# Patient Record
Sex: Male | Born: 1990 | Race: Black or African American | Hispanic: No | Marital: Single | Smoking: Never smoker
Health system: Southern US, Community
[De-identification: ages and names within clinical notes are randomized; demographics above are authoritative.]

## PROBLEM LIST (undated history)

## (undated) DIAGNOSIS — I10 Essential (primary) hypertension: Secondary | ICD-10-CM

---

## 2014-04-17 ENCOUNTER — Emergency Department (HOSPITAL_COMMUNITY)
Admission: EM | Admit: 2014-04-17 | Discharge: 2014-04-17 | Disposition: A | Discharge status: Home / Self Care | Payer: No Typology Code available for payment source | Attending: Emergency Medicine | Admitting: Emergency Medicine

## 2014-04-17 ENCOUNTER — Encounter (HOSPITAL_COMMUNITY): Payer: Self-pay | Admitting: Physical Medicine and Rehabilitation

## 2014-04-17 DIAGNOSIS — S79922A Unspecified injury of left thigh, initial encounter: Secondary | ICD-10-CM | POA: Insufficient documentation

## 2014-04-17 DIAGNOSIS — M79652 Pain in left thigh: Secondary | ICD-10-CM

## 2014-04-17 DIAGNOSIS — Y92410 Unspecified street and highway as the place of occurrence of the external cause: Secondary | ICD-10-CM | POA: Insufficient documentation

## 2014-04-17 DIAGNOSIS — M545 Low back pain, unspecified: Secondary | ICD-10-CM

## 2014-04-17 DIAGNOSIS — S199XXA Unspecified injury of neck, initial encounter: Secondary | ICD-10-CM | POA: Diagnosis not present

## 2014-04-17 DIAGNOSIS — I10 Essential (primary) hypertension: Secondary | ICD-10-CM | POA: Diagnosis not present

## 2014-04-17 DIAGNOSIS — Y998 Other external cause status: Secondary | ICD-10-CM | POA: Diagnosis not present

## 2014-04-17 DIAGNOSIS — M542 Cervicalgia: Secondary | ICD-10-CM

## 2014-04-17 DIAGNOSIS — Y9389 Activity, other specified: Secondary | ICD-10-CM | POA: Insufficient documentation

## 2014-04-17 DIAGNOSIS — S3992XA Unspecified injury of lower back, initial encounter: Secondary | ICD-10-CM | POA: Insufficient documentation

## 2014-04-17 DIAGNOSIS — M549 Dorsalgia, unspecified: Secondary | ICD-10-CM

## 2014-04-17 HISTORY — DX: Essential (primary) hypertension: I10

## 2014-04-17 MED ORDER — IBUPROFEN 600 MG PO TABS: 600.0000 mg | ORAL_TABLET | Freq: Four times a day (QID) | ORAL | Status: DC | PRN

## 2014-04-17 MED ORDER — METHOCARBAMOL 500 MG PO TABS
500.0000 mg | ORAL_TABLET | Freq: Two times a day (BID) | ORAL | Status: DC
Start: 2014-04-17 — End: 2016-02-14

## 2014-04-17 MED ORDER — TRAMADOL HCL 50 MG PO TABS: 50.0000 mg | ORAL_TABLET | Freq: Four times a day (QID) | ORAL | Status: DC | PRN
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
## 2015-02-03 ENCOUNTER — Emergency Department (HOSPITAL_COMMUNITY)
Admission: EM | Admit: 2015-02-03 | Discharge: 2015-02-03 | Disposition: A | Discharge status: Home / Self Care | Payer: Self-pay | Attending: Emergency Medicine | Admitting: Emergency Medicine

## 2015-02-03 ENCOUNTER — Encounter (HOSPITAL_COMMUNITY): Payer: Self-pay | Admitting: Emergency Medicine

## 2015-02-03 DIAGNOSIS — J020 Streptococcal pharyngitis: Secondary | ICD-10-CM | POA: Insufficient documentation

## 2015-02-03 DIAGNOSIS — I10 Essential (primary) hypertension: Secondary | ICD-10-CM | POA: Insufficient documentation

## 2015-02-03 MED ORDER — AZITHROMYCIN 250 MG PO TABS
250.0000 mg | ORAL_TABLET | Freq: Every day | ORAL | Status: DC
Start: 2015-02-03 — End: 2016-02-14

## 2015-02-03 MED ORDER — IBUPROFEN 400 MG PO TABS
800.0000 mg | ORAL_TABLET | Freq: Once | ORAL | Status: AC
  Administered 2015-02-03: 800 mg via ORAL
  Filled 2015-02-03: qty 2

## 2015-02-03 NOTE — Discharge Instructions (Signed)

## 2015-02-03 NOTE — ED Provider Notes (Signed)
CSN: [id number]     Arrival date & time 02/03/15  0818 History  By signing my name below, I, Roy Henderson, attest that this documentation has been prepared under the direction and in the presence of Teressa Lower, NP Electronically Signed: Charline Bills, ED Scribe 02/03/2015 at 9:18 AM.   Chief Complaint  Patient presents with  . Sore Throat   The history is provided by the patient. No language interpreter was used.   HPI Comments: Roy Henderson is a 25 y.o. male who presents to the Emergency Department complaining of sudden onset of fever around 3 AM this morning. Tmax 102 F, ED temperature 100.6 F. Pt reports associated chills, sore throat, congestion and HA. Pt states that he was diagnosed with strep 2 weeks ago; states he gets it off. Pt was treated with Amoxicillin. No treatments tried PTA. He denies rash and any other symptoms. No sick contacts.  Past Medical History  Diagnosis Date  . Hypertension    History reviewed. No pertinent past surgical history. No family history on file. Social History  Substance Use Topics  . Smoking status: Never Smoker   . Smokeless tobacco: None  . Alcohol Use: No    Review of Systems  Constitutional: Positive for fever and chills.  HENT: Positive for congestion and sore throat.   Skin: Negative for rash.  Neurological: Positive for headaches.  All other systems reviewed and are negative.  Allergies  Review of patient's allergies indicates no known allergies.  Home Medications   Prior to Admission medications   Medication Sig Start Date End Date Taking? Authorizing Provider  ibuprofen (ADVIL,MOTRIN) 600 MG tablet Take 1 tablet (600 mg total) by mouth every 6 (six) hours as needed. 04/17/14   Junius Finner, PA-C  methocarbamol (ROBAXIN) 500 MG tablet Take 1 tablet (500 mg total) by mouth 2 (two) times daily. 04/17/14   Junius Finner, PA-C  traMADol (ULTRAM) 50 MG tablet Take 1 tablet (50 mg total) by mouth every 6 (six) hours as needed.  04/17/14   Junius Finner, PA-C   BP 148/96 mmHg  Pulse 113  Temp(Src) 100.6 F (38.1 C) (Oral)  Resp 22  Ht 6' (1.829 m)  Wt 284 lb (128.822 kg)  BMI 38.51 kg/m2  SpO2 100% Physical Exam  Constitutional: He is oriented to person, place, and time. He appears well-developed and well-nourished. No distress.  HENT:  Head: Normocephalic and atraumatic.  Right Ear: External ear normal.  Left Ear: External ear normal.  Bilateral pharyngeal swelling and exudate  Eyes: Conjunctivae and EOM are normal.  Neck: Normal range of motion. Neck supple. No tracheal deviation present.  Cardiovascular: Normal rate.   Pulmonary/Chest: Effort normal. No respiratory distress.  Abdominal: Soft. Bowel sounds are normal. There is no tenderness.  Musculoskeletal: Normal range of motion.  Neurological: He is alert and oriented to person, place, and time.  Skin: Skin is warm and dry.  Psychiatric: He has a normal mood and affect. His behavior is normal.  Nursing note and vitals reviewed.  ED Course  Procedures (including critical care time) DIAGNOSTIC STUDIES: Oxygen Saturation is 100% on RA, normal by my interpretation.    COORDINATION OF CARE: 9:12 AM-Discussed treatment plan which includes azithromycin with pt at bedside and pt agreed to plan.   Labs Review Labs Reviewed - No data to display  Imaging Review No results found. I have personally reviewed and evaluated these images and lab results as part of my medical decision-making.   EKG Interpretation None  MDM   Final diagnoses:  Strep sore throat    Pt was treated 2 weeks ago with amoxicillin. Will give zithromax. Discussed return precautions and follow up  I personally performed the services described in this documentation, which was scribed in my presence. The recorded information has been reviewed and is accurate.    Teressa Lower, NP 02/03/15 1610  Alvira Monday, MD 02/03/15 2341

## 2015-02-03 NOTE — ED Notes (Signed)
C/o sore throat, HA and congestion onset today, no V/D or other complaints, A/O X4 and in NAD

## 2015-03-04 ENCOUNTER — Emergency Department (HOSPITAL_COMMUNITY)
Admission: EM | Admit: 2015-03-04 | Discharge: 2015-03-04 | Disposition: A | Discharge status: Home / Self Care | Payer: 59 | Attending: Emergency Medicine | Admitting: Emergency Medicine

## 2015-03-04 ENCOUNTER — Encounter (HOSPITAL_COMMUNITY): Payer: Self-pay

## 2015-03-04 DIAGNOSIS — Z79899 Other long term (current) drug therapy: Secondary | ICD-10-CM | POA: Diagnosis not present

## 2015-03-04 DIAGNOSIS — I10 Essential (primary) hypertension: Secondary | ICD-10-CM | POA: Insufficient documentation

## 2015-03-04 DIAGNOSIS — J020 Streptococcal pharyngitis: Secondary | ICD-10-CM | POA: Insufficient documentation

## 2015-03-04 DIAGNOSIS — R61 Generalized hyperhidrosis: Secondary | ICD-10-CM | POA: Insufficient documentation

## 2015-03-04 DIAGNOSIS — J029 Acute pharyngitis, unspecified: Secondary | ICD-10-CM | POA: Diagnosis present

## 2015-03-04 LAB — RAPID STREP SCREEN (MED CTR MEBANE ONLY): STREPTOCOCCUS, GROUP A SCREEN (DIRECT): NEGATIVE

## 2015-03-04 MED ORDER — DEXAMETHASONE SODIUM PHOSPHATE 10 MG/ML IJ SOLN
6.0000 mg | Freq: Once | INTRAMUSCULAR | Status: AC
  Administered 2015-03-04: 6 mg via INTRAMUSCULAR
  Filled 2015-03-04: qty 1

## 2015-03-04 MED ORDER — PENICILLIN G BENZATHINE 1200000 UNIT/2ML IM SUSP
1.2000 10*6.[IU] | Freq: Once | INTRAMUSCULAR | Status: AC
  Administered 2015-03-04: 1.2 10*6.[IU] via INTRAMUSCULAR
  Filled 2015-03-04: qty 2

## 2015-03-04 MED ORDER — KETOROLAC TROMETHAMINE 60 MG/2ML IM SOLN
60.0000 mg | Freq: Once | INTRAMUSCULAR | Status: AC
  Administered 2015-03-04: 60 mg via INTRAMUSCULAR
  Filled 2015-03-04: qty 2

## 2015-03-04 NOTE — ED Notes (Signed)
Sore throat began yesterday with chills ,  Pt. Reports that when he woke up this am he felt worse.  Pt. Denies any n/v/d. NO fever but does have chills. No body aches, intermittent lt. earache

## 2015-03-04 NOTE — Discharge Instructions (Signed)
Pharyngitis Pharyngitis is redness, pain, and swelling (inflammation) of your pharynx.  CAUSES  Pharyngitis is usually caused by infection. Most of the time, these infections are from viruses (viral) and are part of a cold. However, sometimes pharyngitis is caused by bacteria (bacterial). Pharyngitis can also be caused by allergies. Viral pharyngitis may be spread from person to person by coughing, sneezing, and personal items or utensils (cups, forks, spoons, toothbrushes). Bacterial pharyngitis may be spread from person to person by more intimate contact, such as kissing.  SIGNS AND SYMPTOMS  Symptoms of pharyngitis include:   Sore throat.   Tiredness (fatigue).   Low-grade fever.   Headache.  Joint pain and muscle aches.  Skin rashes.  Swollen lymph nodes.  Plaque-like film on throat or tonsils (often seen with bacterial pharyngitis). DIAGNOSIS  Your health care provider will ask you questions about your illness and your symptoms. Your medical history, along with a physical exam, is often all that is needed to diagnose pharyngitis. Sometimes, a rapid strep test is done. Other lab tests may also be done, depending on the suspected cause.  TREATMENT  Viral pharyngitis will usually get better in 3-4 days without the use of medicine. Bacterial pharyngitis is treated with medicines that kill germs (antibiotics).  HOME CARE INSTRUCTIONS   Drink enough water and fluids to keep your urine clear or pale yellow.   Only take over-the-counter or prescription medicines as directed by your health care provider:   If you are prescribed antibiotics, make sure you finish them even if you start to feel better.   Do not take aspirin.   Get lots of rest.   Gargle with 8 oz of salt water ( tsp of salt per 1 qt of water) as often as every 1-2 hours to soothe your throat.   Throat lozenges (if you are not at risk for choking) or sprays may be used to soothe your throat. SEEK MEDICAL  CARE IF:   You have large, tender lumps in your neck.  You have a rash.  You cough up green, yellow-brown, or bloody spit. SEEK IMMEDIATE MEDICAL CARE IF:   Your neck becomes stiff.  You drool or are unable to swallow liquids.  You vomit or are unable to keep medicines or liquids down.  You have severe pain that does not go away with the use of recommended medicines.  You have trouble breathing (not caused by a stuffy nose). MAKE SURE YOU:   Understand these instructions.  Will watch your condition.  Will get help right away if you are not doing well or get worse.   This information is not intended to replace advice given to you by your health care provider. Make sure you discuss any questions you have with your health care provider.   Document Released: 12/27/2004 Document Revised: 10/17/2012 Document Reviewed: 09/03/2012 Elsevier Interactive Patient Education 2016 Else[employer] Inc[employer]  Strep Throat Strep throat is a bacterial infection of the throat. Your health care provider may call the infection tonsillitis or pharyngitis, depending on whether there is swelling in the tonsils or at the back of the throat. Strep throat is most common during the cold months of the year in children who are 10-105 years of age, but it can happen during any season in people of any age. This infection is spread from person to person (contagious) through coughing, sneezing, or close contact. CAUSES Strep throat is caused by the bacteria called Streptococcus pyogenes. RISK FACTORS This condition is more likely to  develop in: °· People who spend time in crowded places where the infection can spread easily. °· People who have close contact with someone who has strep throat. °SYMPTOMS °Symptoms of this condition include: °· Fever or chills.   °· Redness, swelling, or pain in the tonsils or throat. °· Pain or difficulty when swallowing. °· White or yellow spots on the tonsils or throat. °· Swollen, tender glands  in the neck or under the jaw. °· Red rash all over the body (rare). °DIAGNOSIS °This condition is diagnosed by performing a rapid strep test or by taking a swab of your throat (throat culture test). Results from a rapid strep test are usually ready in a few minutes, but throat culture test results are available after one or two days. °TREATMENT °This condition is treated with antibiotic medicine. °HOME CARE INSTRUCTIONS °Medicines °· Take over-the-counter and prescription medicines only as told by your health care provider. °· Take your antibiotic as told by your health care provider. Do not stop taking the antibiotic even if you start to feel better. °· Have family members who also have a sore throat or fever tested for strep throat. They may need antibiotics if they have the strep infection. °Eating and Drinking °· Do not share food, drinking cups, or personal items that could cause the infection to spread to other people. °· If swallowing is difficult, try eating soft foods until your sore throat feels better. °· Drink enough fluid to keep your urine clear or pale yellow. °General Instructions °· Gargle with a salt-water mixture 3-4 times per day or as needed. To make a salt-water mixture, completely dissolve ½-1 tsp of salt in 1 cup of warm water. °· Make sure that all household members wash their hands well. °· Get plenty of rest. °· Stay home from school or work until you have been taking antibiotics for 24 hours. °· Keep all follow-up visits as told by your health care provider. This is important. °SEEK MEDICAL CARE IF: °· The glands in your neck continue to get bigger. °· You develop a rash, cough, or earache. °· You cough up a thick liquid that is green, yellow-brown, or bloody. °· You have pain or discomfort that does not get better with medicine. °· Your problems seem to be getting worse rather than better. °· You have a fever. °SEEK IMMEDIATE MEDICAL CARE IF: °· You have new symptoms, such as vomiting,  severe headache, stiff or painful neck, chest pain, or shortness of breath. °· You have severe throat pain, drooling, or changes in your voice. °· You have swelling of the neck, or the skin on the neck becomes red and tender. °· You have signs of dehydration, such as fatigue, dry mouth, and decreased urination. °· You become increasingly sleepy, or you cannot wake up completely. °· Your joints become red or painful. °  °This information is not intended to replace advice given to you by your health care provider. Make sure you discuss any questions you have with your health care provider. °  °Document Released: 12/25/1999 Document Revised: 09/17/2014 Document Reviewed: 04/21/2014 °Elsevier Interactive Patient Education ©2016 Elsevier Inc. ° °

## 2015-03-04 NOTE — ED Provider Notes (Signed)
CSN: [id number]     Arrival date & time 03/04/15  2841 History  By signing my name below, I, Tanda Rockers, attest that this documentation has been prepared under the direction and in the presence of Danelle Berry, PA-C. Electronically Signed: Tanda Rockers, ED Scribe. 03/04/2015. 9:08 AM.   Chief Complaint  Patient presents with  . Sore Throat   No language interpreter was used.     HPI Comments: Roy Henderson is a 25 y.o. male who presents to the Emergency Department complaining of gradual onset, constant, 7/10, worsening, sore throat x 1 day. Pt is able to tolerate his own secretions, can sip fluids, however it is painful. Pt also complains of chills, fever, nasal congestion, rhinorrhea, dry cough, and intermittent left ear pain for several days, sweats last night.  He came to the ER because over the past day his throat suddenly worsened and he has had strep throat 2 times in the past year, it now feels similar to when he's had strep.  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 Denies neck pain, voice change, nausea, vomiting, shortness of breath, diarrhea, sinus pressure, or any other associated symptoms.   Past Medical History  Diagnosis Date  . Hypertension    History reviewed. No pertinent past surgical history. No family history on file. Social History  Substance Use Topics  . Smoking status: Never Smoker   . Smokeless tobacco: None  . Alcohol Use: No    Review of Systems  Constitutional: Positive for fever, chills and diaphoresis.  HENT: Positive for congestion, ear pain (left), rhinorrhea and sore throat. Negative for sinus pressure and voice change.   Respiratory: Positive for cough. Negative for shortness of breath.   Gastrointestinal: Negative for nausea, vomiting and diarrhea.  Musculoskeletal: Negative for neck pain.  All other systems reviewed and are negative.  Allergies  Review of patient's allergies indicates no known allergies.  Home  Medications   Prior to Admission medications   Medication Sig Start Date End Date Taking? Authorizing Provider  lisinopril (PRINIVIL,ZESTRIL) 30 MG tablet Take 30 mg by mouth daily.   Yes Historical Provider, MD  azithromycin (ZITHROMAX) 250 MG tablet Take 1 tablet (250 mg total) by mouth daily. Take first 2 tablets together, then 1 every day until finished. 02/03/15   Teressa Lower, NP  ibuprofen (ADVIL,MOTRIN) 600 MG tablet Take 1 tablet (600 mg total) by mouth every 6 (six) hours as needed. 04/17/14   Junius Finner, PA-C  methocarbamol (ROBAXIN) 500 MG tablet Take 1 tablet (500 mg total) by mouth 2 (two) times daily. 04/17/14   Junius Finner, PA-C  traMADol (ULTRAM) 50 MG tablet Take 1 tablet (50 mg total) by mouth every 6 (six) hours as needed. 04/17/14   Junius Finner, PA-C   BP 143/94 mmHg  Pulse 102  Temp(Src) 99 F (37.2 C) (Oral)  Resp 18  SpO2 100%   Physical Exam  Constitutional: He is oriented to person, place, and time. He appears well-developed and well-nourished. He is cooperative.  Non-toxic appearance. He does not have a sickly appearance. He does not appear ill. No distress.  HENT:  Head: Normocephalic and atraumatic.  Right Ear: Tympanic membrane and external ear normal.  Left Ear: Tympanic membrane and external ear normal.  Nose: Nose normal.  Mouth/Throat: Uvula is midline and mucous membranes are normal. Mucous membranes are not pale, not dry and not cyanotic. No trismus in the jaw. No uvula swelling. Oropharyngeal exudate, posterior oropharyngeal  edema and posterior oropharyngeal erythema present. No tonsillar abscesses.  Tonsils are erythematous with exudates. Left tonsil4+. Right 3+, uvula midline, no asymmetry to anterior neck, bilateral proximal cervical lymphadenopathy Bilateral TM normal Nasal mucosa edematous with discharge. Pt able to swallow and clear secretions  Eyes: Conjunctivae and EOM are normal. Pupils are equal, round, and reactive to light. Right eye  exhibits no discharge. Left eye exhibits no discharge. No scleral icterus.  Neck: Trachea normal, normal range of motion and full passive range of motion without pain. Neck supple. No JVD present. No tracheal deviation present. No thyromegaly present.  Phonation normal  Cardiovascular: Normal rate, regular rhythm, normal heart sounds and intact distal pulses.  Exam reveals no gallop and no friction rub.   No murmur heard. Pulmonary/Chest: Effort normal and breath sounds normal. No respiratory distress. He has no wheezes. He has no rales. He exhibits no tenderness.  Abdominal: Soft. Bowel sounds are normal. He exhibits no distension and no mass. There is no tenderness. There is no rebound and no guarding.  Musculoskeletal: Normal range of motion. He exhibits no edema or tenderness.  Lymphadenopathy:       Head (right side): Submental, submandibular and tonsillar adenopathy present.       Head (left side): Submental, submandibular and tonsillar adenopathy present.    He has cervical adenopathy.       Right cervical: Superficial cervical adenopathy present.       Left cervical: Superficial cervical adenopathy present.  Neurological: He is alert and oriented to person, place, and time. He has normal reflexes. No cranial nerve deficit. He exhibits normal muscle tone. Coordination normal.  Skin: Skin is warm and dry. No rash noted. He is not diaphoretic. No erythema. No pallor.  Psychiatric: He has a normal mood and affect. His behavior is normal. Judgment and thought content normal.  Nursing note and vitals reviewed.   ED Course  Procedures (including critical care time)  DIAGNOSTIC STUDIES: Oxygen Saturation is 100% on RA, normal by my interpretation.    COORDINATION OF CARE: 9:02 AM-Discussed treatment plan which includes rapid strep test with pt at bedside and pt agreed to plan.   Labs Review Labs Reviewed  RAPID STREP SCREEN (NOT AT [facility])  CULTURE, GROUP A STREP [facility])    Imaging  Review No results found. I have personally reviewed and evaluated these lab results as part of my medical decision-making.   EKG Interpretation None      MDM   Pt with edematous, erythematous and exudative OP and tonsils.  Left tonsil more prominent, but uvula is midline and pt does not have any other concerning signs of sx of PTA. No trismus. Pt able to tolerate PO's. No dyspnea.    He has hx of 2 recent episodes of strep throat, exposure at job to many sick children.  Rapid strep negative, however given hx and exposure, will treat for strep pharyngitis, and give ENT referral. Pt given toradal, decadron and bicillin injections. Marland Kitchen Specific return precautions discussed,, pt verbalized understanding. Pt able to drink water in ED without difficulty with intact air way.  He was discharged in good condition.  Work note provided.    Final diagnoses:  Strep throat   I personally performed the services described in this documentation, which was scribed in my presence. The recorded information has been reviewed and is accurate.        Danelle Berry, PA-C 03/04/15 2248  Eber Hong, MD 03/05/15 [phone]

## 2015-03-06 LAB — CULTURE, GROUP A STREP (THRC)

## 2015-03-07 ENCOUNTER — Telehealth (HOSPITAL_COMMUNITY): Payer: Self-pay

## 2015-03-07 NOTE — Telephone Encounter (Signed)
Post ED Visit - Positive Culture Follow-up  Culture report reviewed by antimicrobial stewardship pharmacist:   Enzo Bi, Pharm.D.  Celedonio Miyamoto, Pharm.D., BCPS  Garvin Fila, Pharm.D.  Georgina Pillion, Pharm.D., BCPS  [place], [address].D., BCPS, AAHIVP  Estella Husk, Pharm.D., BCPS, AAHIVP  Tennis Must, Pharm.D.  Rob Oswaldo Done, [address].D. Carmon Sails Rumbarger, Pharm.D.  Positive throat culture, Group A Strep Received Bicillin LA in the ED, organism sensitive to the same and no further patient follow-up is required at this time.  Arvid Right 03/07/2015, 10:38 PM

## 2015-03-08 ENCOUNTER — Telehealth: Payer: Self-pay | Admitting: *Deleted

## 2016-02-14 ENCOUNTER — Encounter (HOSPITAL_COMMUNITY): Payer: Self-pay | Admitting: Emergency Medicine

## 2016-02-14 ENCOUNTER — Ambulatory Visit (HOSPITAL_COMMUNITY)
Admission: EM | Admit: 2016-02-14 | Discharge: 2016-02-14 | Disposition: A | Discharge status: Home / Self Care | Payer: 59 | Attending: Family Medicine | Admitting: Family Medicine

## 2016-02-14 DIAGNOSIS — J029 Acute pharyngitis, unspecified: Secondary | ICD-10-CM | POA: Insufficient documentation

## 2016-02-14 DIAGNOSIS — I10 Essential (primary) hypertension: Secondary | ICD-10-CM | POA: Insufficient documentation

## 2016-02-14 LAB — POCT RAPID STREP A: Streptococcus, Group A Screen (Direct): NEGATIVE

## 2016-02-14 MED ORDER — PENICILLIN V POTASSIUM 500 MG PO TABS
500.0000 mg | ORAL_TABLET | Freq: Three times a day (TID) | ORAL | 0 refills | Status: DC
Start: 2016-02-14 — End: 2016-12-14

## 2016-02-14 MED ORDER — CHLORHEXIDINE GLUCONATE 0.12 % MT SOLN: 15.0000 mL | Freq: Two times a day (BID) | OROMUCOSAL | 0 refills | Status: DC

## 2016-02-14 NOTE — ED Provider Notes (Addendum)
[facility]    CSN: [id number] Arrival date & time: 02/14/16  1606     History   Chief Complaint Chief Complaint  Patient presents with  . Sore Throat    HPI Roy Henderson is a 26 y.o. male.   The patient presented to the [facility] with a complaint of a sore throat x 4 days. The patient denied any fever and did report a hx of strep throat.   No cold symptoms such as cough or congestion.      Past Medical History:  Diagnosis Date  . Hypertension     There are no active problems to display for this patient.   History reviewed. No pertinent surgical history.     Home Medications    Prior to Admission medications   Medication Sig Start Date End Date Taking? Authorizing Provider  chlorhexidine (PERIDEX) 0.12 % solution Use as directed 15 mLs in the mouth or throat 2 (two) times daily. 02/14/16   Elvina Sidle, MD  penicillin v potassium (VEETID) 500 MG tablet Take 1 tablet (500 mg total) by mouth 3 (three) times daily. 02/14/16   Elvina Sidle, MD    Family History History reviewed. No pertinent family history.  Social History Social History  Substance Use Topics  . Smoking status: Never Smoker  . Smokeless tobacco: Not on file  . Alcohol use No     Allergies   Patient has no known allergies.   Review of Systems Review of Systems  Constitutional: Negative.   HENT: Positive for sore throat.   Eyes: Negative.   Gastrointestinal: Negative.   Musculoskeletal: Negative.   Neurological: Negative.   All other systems reviewed and are negative.    Physical Exam Triage Vital Signs ED Triage Vitals [02/14/16 1629]  Enc Vitals Group     BP 133/77     Pulse Rate 105     Resp 20     Temp 97.9 F (36.6 C)     Temp Source Oral     SpO2 100 %     Weight      Height      Head Circumference      Peak Flow      Pain Score 0     Pain Loc      Pain Edu?      Excl. in GC?    No data found.   Updated Vital Signs BP 133/77 (BP Location: Right  Arm)   Pulse 105   Temp 97.9 F (36.6 C) (Oral)   Resp 20   SpO2 100%    Physical Exam  Constitutional: He is oriented to person, place, and time. He appears well-developed and well-nourished.  HENT:  Right Ear: External ear normal.  Left Ear: External ear normal.  Mouth/Throat: Oropharyngeal exudate present.  Tonsils are enlarged and there is a faint, wispy white exudate over each of the tonsils.  Eyes: Conjunctivae and EOM are normal.  Neck: Normal range of motion. Neck supple.  Pulmonary/Chest: Effort normal.  Musculoskeletal: Normal range of motion.  Neurological: He is alert and oriented to person, place, and time.  Skin: Skin is warm and dry.  Nursing note and vitals reviewed.    UC Treatments / Results  Labs (all labs ordered are listed, but only abnormal results are displayed) Labs Reviewed  POCT RAPID STREP A    EKG  EKG Interpretation None       Radiology No results found.  Procedures Procedures (including critical care time)  Medications Ordered in UC Medications - No data to display   Initial Impression / Assessment and Plan / UC Course  I have reviewed the triage vital signs and the nursing notes.  Pertinent labs & imaging results that were available during my care of the patient were reviewed by me and considered in my medical decision making (see chart for details).    Final Clinical Impressions(s) / UC Diagnoses   Final diagnoses:  Acute pharyngitis, unspecified etiology    New Prescriptions New Prescriptions   CHLORHEXIDINE (PERIDEX) 0.12 % SOLUTION    Use as directed 15 mLs in the mouth or throat 2 (two) times daily.   PENICILLIN V POTASSIUM (VEETID) 500 MG TABLET    Take 1 tablet (500 mg total) by mouth 3 (three) times daily.     Elvina SidleKurt Joangel Vanosdol, MD 02/14/16 1724    Elvina SidleKurt Shabrea Weldin, MD 02/14/16 [phone]

## 2016-02-14 NOTE — ED Triage Notes (Signed)
[facility] with a complaint of a sore throat x 4 days. The patient denied any fever and did report a hx of strep throat.

## 2016-02-14 NOTE — Discharge Instructions (Signed)
Your strep test was negative. We're ordering a disinfecting mouthwash to ease her symptoms and prevent worsening of the symptoms. A secondary strep test will be run to confirm the initial rapid strep test. This should be done in 2 days.

## 2016-02-16 LAB — CULTURE, GROUP A STREP (THRC)

## 2016-10-26 ENCOUNTER — Encounter: Payer: Self-pay | Admitting: *Deleted

## 2016-10-26 ENCOUNTER — Emergency Department
Admission: EM | Admit: 2016-10-26 | Discharge: 2016-10-26 | Disposition: A | Discharge status: Home / Self Care | Payer: BLUE CROSS/BLUE SHIELD | Attending: Emergency Medicine | Admitting: Emergency Medicine

## 2016-10-26 DIAGNOSIS — J069 Acute upper respiratory infection, unspecified: Secondary | ICD-10-CM | POA: Diagnosis not present

## 2016-10-26 DIAGNOSIS — J029 Acute pharyngitis, unspecified: Secondary | ICD-10-CM | POA: Diagnosis present

## 2016-10-26 DIAGNOSIS — Z79899 Other long term (current) drug therapy: Secondary | ICD-10-CM | POA: Insufficient documentation

## 2016-10-26 LAB — POCT RAPID STREP A: Streptococcus, Group A Screen (Direct): NEGATIVE

## 2016-10-26 MED ORDER — LIDOCAINE VISCOUS 2 % MT SOLN
15.0000 mL | Freq: Once | OROMUCOSAL | Status: AC
  Administered 2016-10-26: 15 mL via OROMUCOSAL
  Filled 2016-10-26: qty 15

## 2016-10-26 MED ORDER — BENZONATATE 100 MG PO CAPS: 100.0000 mg | ORAL_CAPSULE | Freq: Three times a day (TID) | ORAL | 0 refills | Status: AC | PRN

## 2016-10-26 MED ORDER — FLUTICASONE PROPIONATE 50 MCG/ACT NA SUSP: 1.0000 | Freq: Every day | NASAL | 2 refills | Status: DC

## 2016-10-26 MED ORDER — DEXAMETHASONE SODIUM PHOSPHATE 10 MG/ML IJ SOLN
10.0000 mg | Freq: Once | INTRAMUSCULAR | Status: AC
Start: 2016-10-26 — End: 2016-10-26
  Administered 2016-10-26: 10 mg via INTRAMUSCULAR
  Filled 2016-10-26: qty 1

## 2016-10-26 NOTE — ED Provider Notes (Signed)
[facility] Emergency Department Provider Note  ____________________________________________  Time seen: Approximately 9:29 PM  I have reviewed the triage vital signs and the nursing notes.   HISTORY  Chief Complaint Sore Throat    HPI Roy Henderson is a 26 y.o. male presents to the emergency department with rhinorrhea, congestion and nonproductive cough for the past 4 days. Patient's primary chief complaint is pharyngitis. He denies associated headache or abdominal discomfort. Patient has been afebrile and denies chills. No major changes in stooling or urinary habits. No recent travel. No alleviating measures have been attempted.   Past Medical History:  Diagnosis Date  . Hypertension     There are no active problems to display for this patient.   History reviewed. No pertinent surgical history.  Prior to Admission medications   Medication Sig Start Date End Date Taking? Authorizing Provider  benzonatate (TESSALON PERLES) 100 MG capsule Take 1 capsule (100 mg total) by mouth 3 (three) times daily as needed for cough. 10/26/16 11/02/16  Orvil Feil, PA-C  chlorhexidine (PERIDEX) 0.12 % solution Use as directed 15 mLs in the mouth or throat 2 (two) times daily. 02/14/16   Elvina Sidle, MD  fluticasone (FLONASE) 50 MCG/ACT nasal spray Place 1 spray into both nostrils daily. 10/26/16 10/31/16  Orvil Feil, PA-C  penicillin v potassium (VEETID) 500 MG tablet Take 1 tablet (500 mg total) by mouth 3 (three) times daily. 02/14/16   Elvina Sidle, MD    Allergies Patient has no known allergies.  History reviewed. No pertinent family history.  Social History Social History  Substance Use Topics  . Smoking status: Never Smoker  . Smokeless tobacco: Never Used  . Alcohol use No     Review of Systems  Constitutional: Patient has had fever.  Eyes: No visual changes. No discharge ENT: Patient has had congestion.  Cardiovascular: no chest  pain. Respiratory: Patient has had non-productive cough.  No SOB. Genitourinary: Negative for dysuria. No hematuria Musculoskeletal: Patient has had myalgias. Skin: Negative for rash, abrasions, lacerations, ecchymosis. Neurological: Negative for headaches, focal weakness or numbness.   ____________________________________________   PHYSICAL EXAM:  VITAL SIGNS: ED Triage Vitals  Enc Vitals Group     BP 10/26/16 1946 127/81     Pulse Rate 10/26/16 1946 93     Resp 10/26/16 1946 16     Temp 10/26/16 1946 98.4 F (36.9 C)     Temp Source 10/26/16 1946 Oral     SpO2 10/26/16 1946 100 %     Weight 10/26/16 1943 260 lb (117.9 kg)     Height 10/26/16 1943 6' (1.829 m)     Head Circumference --      Peak Flow --      Pain Score 10/26/16 1942 8     Pain Loc --      Pain Edu? --      Excl. in GC? --      Constitutional: Alert and oriented. Patient is lying supine in bed.  Eyes: Conjunctivae are normal. PERRL. EOMI. Head: Atraumatic. ENT:      Ears: Tympanic membranes are injected bilaterally without evidence of effusion or purulent exudate. Bony landmarks are visualized bilaterally. No pain with palpation at the tragus.      Nose: Nasal turbinates are edematous and erythematous. Copious rhinorrhea visualized.      Mouth/Throat: Mucous membranes are moist. Posterior pharynx is erythematous. No tonsillar hypertrophy or purulent exudate. Uvula is midline. Neck: Full range of motion. No  pain is elicited with flexion at the neck. Hematological/Lymphatic/Immunilogical: No cervical lymphadenopathy. Cardiovascular: Normal rate, regular rhythm. Normal S1 and S2.  Good peripheral circulation. Respiratory: Normal respiratory effort without tachypnea or retractions. Lungs CTAB. Good air entry to the bases with no decreased or absent breath sounds. Gastrointestinal: Bowel sounds 4 quadrants. Soft and nontender to palpation. No guarding or rigidity. No palpable masses. No distention. No CVA  tenderness.  Skin:  Skin is warm, dry and intact. No rash noted. Psychiatric: Mood and affect are normal. Speech and behavior are normal. Patient exhibits appropriate insight and judgement.   ____________________________________________   LABS (all labs ordered are listed, but only abnormal results are displayed)  Labs Reviewed  POCT RAPID STREP A   ____________________________________________  EKG   ____________________________________________  RADIOLOGY   No results found.  ____________________________________________    PROCEDURES  Procedure(s) performed:    Procedures    Medications  dexamethasone (DECADRON) injection 10 mg (not administered)     ____________________________________________   INITIAL IMPRESSION / ASSESSMENT AND PLAN / ED COURSE  Pertinent labs & imaging results that were available during my care of the patient were reviewed by me and considered in my medical decision making (see chart for details).  Review of the [place] CSRS was performed in accordance of the [facility] prior to dispensing any controlled drugs.     Assessment and plan Viral URI Patient presents to the emergency department with pharyngitis rhinorrhea, congestion and nonproductive cough for the past 4 days. History and physical exam findings are consistent with a viral upper respiratory tract infection. Patient was discharged with Flonase and Tessalon Perles. Patient was advised to follow up with primary care as needed. Work note was provided.    ____________________________________________  FINAL CLINICAL IMPRESSION(S) / ED DIAGNOSES  Final diagnoses:  Viral upper respiratory tract infection      NEW MEDICATIONS STARTED DURING THIS VISIT:  New Prescriptions   BENZONATATE (TESSALON PERLES) 100 MG CAPSULE    Take 1 capsule (100 mg total) by mouth 3 (three) times daily as needed for cough.   FLUTICASONE (FLONASE) 50 MCG/ACT NASAL SPRAY    Place 1 spray into both  nostrils daily.        This chart was dictated using voice recognition software/Dragon. Despite best efforts to proofread, errors can occur which can change the meaning. Any change was purely unintentional.    Orvil FeilWoods, Weston Fulco M, PA-C 10/26/16 2134    Dionne BucySiadecki, Sebastian, MD 10/26/16 [phone]

## 2016-10-26 NOTE — ED Triage Notes (Signed)
Pt to ED reporting pain in throat and non productive chills since Monday. PT reports having chills but denies having taken temp at home. Pt reports he has has strep in the past and does not feel as though this is similar. No SOB reported or increased WOB.

## 2016-12-14 ENCOUNTER — Encounter (HOSPITAL_COMMUNITY): Payer: Self-pay | Admitting: Emergency Medicine

## 2016-12-14 ENCOUNTER — Emergency Department (HOSPITAL_COMMUNITY)
Admission: EM | Admit: 2016-12-14 | Discharge: 2016-12-14 | Disposition: A | Discharge status: Home / Self Care | Payer: BLUE CROSS/BLUE SHIELD | Attending: Emergency Medicine | Admitting: Emergency Medicine

## 2016-12-14 ENCOUNTER — Emergency Department (HOSPITAL_COMMUNITY): Payer: BLUE CROSS/BLUE SHIELD

## 2016-12-14 ENCOUNTER — Other Ambulatory Visit: Payer: Self-pay

## 2016-12-14 DIAGNOSIS — I10 Essential (primary) hypertension: Secondary | ICD-10-CM | POA: Insufficient documentation

## 2016-12-14 DIAGNOSIS — Z79899 Other long term (current) drug therapy: Secondary | ICD-10-CM | POA: Insufficient documentation

## 2016-12-14 DIAGNOSIS — R509 Fever, unspecified: Secondary | ICD-10-CM | POA: Diagnosis present

## 2016-12-14 DIAGNOSIS — B349 Viral infection, unspecified: Secondary | ICD-10-CM | POA: Diagnosis not present

## 2016-12-14 LAB — BASIC METABOLIC PANEL
Anion gap: 9 (ref 5–15)
BUN: 15 mg/dL (ref 6–20)
CO2: 25 mmol/L (ref 22–32)
CREATININE: 1.33 mg/dL — AB (ref 0.61–1.24)
Calcium: 9.2 mg/dL (ref 8.9–10.3)
Chloride: 100 mmol/L — ABNORMAL LOW (ref 101–111)
GFR calc Af Amer: >60 mL/min (ref >60)
GFR calc non Af Amer: >60 mL/min (ref >60)
Glucose, Bld: 111 mg/dL — ABNORMAL HIGH (ref 65–99)
POTASSIUM: 3.8 mmol/L (ref 3.5–5.1)
SODIUM: 134 mmol/L — AB (ref 135–145)

## 2016-12-14 LAB — CBC WITH DIFFERENTIAL/PLATELET
Basophils Absolute: 0 10*3/uL (ref 0.0–0.1)
Basophils Relative: 0 %
Eosinophils Absolute: 0 10*3/uL (ref 0.0–0.7)
Eosinophils Relative: 0 %
HEMATOCRIT: 41.3 % (ref 39.0–52.0)
HEMOGLOBIN: 13.7 g/dL (ref 13.0–17.0)
LYMPHS ABS: 2.2 10*3/uL (ref 0.7–4.0)
LYMPHS PCT: 10 %
MCH: 28.5 pg (ref 26.0–34.0)
MCHC: 33.2 g/dL (ref 30.0–36.0)
MCV: 85.9 fL (ref 78.0–100.0)
Monocytes Absolute: 1.8 10*3/uL — ABNORMAL HIGH (ref 0.1–1.0)
Monocytes Relative: 8 %
NEUTROS ABS: 17.8 10*3/uL — AB (ref 1.7–7.7)
NEUTROS PCT: 82 %
Platelets: 298 10*3/uL (ref 150–400)
RBC: 4.81 MIL/uL (ref 4.22–5.81)
RDW: 13.3 % (ref 11.5–15.5)
WBC: 21.9 10*3/uL — ABNORMAL HIGH (ref 4.0–10.5)

## 2016-12-14 LAB — RAPID STREP SCREEN (MED CTR MEBANE ONLY): Streptococcus, Group A Screen (Direct): NEGATIVE

## 2016-12-14 LAB — INFLUENZA PANEL BY PCR (TYPE A & B)
INFLAPCR: NEGATIVE
INFLBPCR: NEGATIVE

## 2016-12-14 MED ORDER — KETOROLAC TROMETHAMINE 30 MG/ML IJ SOLN
30.0000 mg | Freq: Once | INTRAMUSCULAR | Status: AC
  Administered 2016-12-14: 30 mg via INTRAVENOUS
  Filled 2016-12-14: qty 1

## 2016-12-14 MED ORDER — SODIUM CHLORIDE 0.9 % IV BOLUS (SEPSIS)
1000.0000 mL | Freq: Once | INTRAVENOUS | Status: AC
  Administered 2016-12-14: 1000 mL via INTRAVENOUS

## 2016-12-14 MED ORDER — IBUPROFEN 600 MG PO TABS: 600.0000 mg | ORAL_TABLET | Freq: Four times a day (QID) | ORAL | 0 refills | Status: AC | PRN

## 2016-12-14 MED ORDER — ACETAMINOPHEN 325 MG PO TABS
650.0000 mg | ORAL_TABLET | Freq: Once | ORAL | Status: AC | PRN
  Administered 2016-12-14: 650 mg via ORAL
  Filled 2016-12-14: qty 2

## 2016-12-14 MED ORDER — ACETAMINOPHEN 500 MG PO TABS
1000.0000 mg | ORAL_TABLET | Freq: Once | ORAL | Status: AC
  Administered 2016-12-14: 1000 mg via ORAL
  Filled 2016-12-14: qty 2

## 2016-12-14 MED ORDER — ONDANSETRON 4 MG PO TBDP: 4.0000 mg | ORAL_TABLET | Freq: Three times a day (TID) | ORAL | 0 refills | Status: AC | PRN

## 2016-12-14 NOTE — ED Provider Notes (Signed)
[facility]-EMERGENCY DEPT Provider Note   CSN: [id number] Arrival date & time: 12/14/16  0217     History   Chief Complaint Chief Complaint  Patient presents with  . flu like symptoms    HPI Roy Henderson is a 26 y.o. male.  HPI   Roy Henderson is a 26 y.o. male, with a history of HTN, presenting to the ED with subjective fever, chills, and nasal congestion for the last 3 days.  Accompanied by fatigue, malaise, headache, nausea, diarrhea, and sore throat.  Occasional nonproductive cough.  Headache is generalized, throbbing, moderate, nonradiating, began 2 days ago.  Sore throat is sharp, bilateral, 8/10, nonradiating. Has been intermittently taking Tylenol and ibuprofen.  Last dose of either was yesterday.  Has not yet taken his prescribed medications this morning.  Denies vomiting, hematochezia/melena, chest pain, shortness of breath, abdominal pain, difficulty swallowing, rash, neck pain/stiffness, or any other complaints.   Past Medical History:  Diagnosis Date  . Hypertension     There are no active problems to display for this patient.   History reviewed. No pertinent surgical history.     Home Medications    Prior to Admission medications   Medication Sig Start Date End Date Taking? Authorizing Provider  losartan-hydrochlorothiazide (HYZAAR) 100-12.5 MG tablet Take 1 tablet by mouth daily. 09/12/16  Yes [provider]  ibuprofen (ADVIL,MOTRIN) 600 MG tablet Take 1 tablet (600 mg total) by mouth every 6 (six) hours as needed. 12/14/16   Joy, Shawn C, PA-C  ondansetron (ZOFRAN ODT) 4 MG disintegrating tablet Take 1 tablet (4 mg total) by mouth every 8 (eight) hours as needed for nausea or vomiting. 12/14/16   Joy, Hillard DankerShawn C, PA-C    Family History Family History  Problem Relation Age of Onset  . Hypertension Other   . Cancer Other     Social History Social History   Tobacco Use  . Smoking status: Never Smoker  . Smokeless tobacco:  Never Used  Substance Use Topics  . Alcohol use: Yes    Comment: social  . Drug use: No     Allergies   Ace inhibitors   Review of Systems Review of Systems  Constitutional: Positive for chills, fatigue and fever.  HENT: Positive for congestion and sore throat. Negative for trouble swallowing and voice change.   Respiratory: Positive for cough. Negative for shortness of breath.   Cardiovascular: Negative for chest pain.  Gastrointestinal: Positive for diarrhea and nausea. Negative for abdominal pain, blood in stool and vomiting.  Musculoskeletal: Positive for myalgias.  Neurological: Positive for headaches. Negative for dizziness and light-headedness.  All other systems reviewed and are negative.    Physical Exam Updated Vital Signs BP (!) 120/58 (BP Location: Left Arm)   Pulse (!) 151   Temp (!) 102.7 F (39.3 C) (Oral)   Resp 20   Ht 6' (1.829 m)   Wt 127 kg (280 lb)   SpO2 99%   BMI 37.97 kg/m   Physical Exam  Constitutional: He appears well-developed and well-nourished. No distress.  HENT:  Head: Normocephalic and atraumatic.  Mouth/Throat: Uvula is midline. Oropharyngeal exudate, posterior oropharyngeal edema and posterior oropharyngeal erythema present.  The patient handles oral secretions without difficulty.  No noted voice abnormality.  Speaks without noted hesitation.  Eyes: Conjunctivae are normal.  Neck: Neck supple.  Cardiovascular: Regular rhythm, normal heart sounds and intact distal pulses. Tachycardia present.  Pulmonary/Chest: Effort normal and breath sounds normal. No respiratory distress.  Abdominal:  Soft. There is no tenderness. There is no guarding.  Musculoskeletal: He exhibits no edema.  Lymphadenopathy:    He has no cervical adenopathy.  Neurological: He is alert.  Skin: Skin is warm and dry. Capillary refill takes less than 2 seconds. He is not diaphoretic.  Psychiatric: He has a normal mood and affect. His behavior is normal.  Nursing  note and vitals reviewed.    ED Treatments / Results  Labs (all labs ordered are listed, but only abnormal results are displayed) Labs Reviewed  CBC WITH DIFFERENTIAL/PLATELET - Abnormal; Notable for the following components:      Result Value   WBC 21.9 (*)    Neutro Abs 17.8 (*)    Monocytes Absolute 1.8 (*)    All other components within normal limits  BASIC METABOLIC PANEL - Abnormal; Notable for the following components:   Sodium 134 (*)    Chloride 100 (*)    Glucose, Bld 111 (*)    Creatinine, Ser 1.33 (*)    All other components within normal limits  RAPID STREP SCREEN (NOT AT [facility])  CULTURE, GROUP A STREP [facility])  INFLUENZA PANEL BY PCR (TYPE A & B)  GC/CHLAMYDIA PROBE AMP ([place]) NOT AT [facility]    EKG  EKG Interpretation None       Radiology Dg Chest 2 View  Result Date: 12/14/2016 CLINICAL DATA:  Fever for 2 days.  Body aches and weakness. EXAM: CHEST  2 VIEW COMPARISON:  None. FINDINGS: Lungs are clear. Heart size is normal. No pneumothorax or pleural fluid. No bony abnormality. IMPRESSION: Normal chest. Electronically Signed   By: Drusilla Kannerhomas  Dalessio M.D.   On: 12/14/2016 10:04    Procedures Procedures (including critical care time)  Medications Ordered in ED Medications  acetaminophen (TYLENOL) tablet 650 mg (650 mg Oral Given 12/14/16 0253)  sodium chloride 0.9 % bolus 1,000 mL (0 mLs Intravenous Stopped 12/14/16 0829)  ketorolac (TORADOL) 30 MG/ML injection 30 mg (30 mg Intravenous Given 12/14/16 0709)  sodium chloride 0.9 % bolus 1,000 mL (0 mLs Intravenous Stopped 12/14/16 1054)  acetaminophen (TYLENOL) tablet 1,000 mg (1,000 mg Oral Given 12/14/16 0904)     Initial Impression / Assessment and Plan / ED Course  I have reviewed the triage vital signs and the nursing notes.  Pertinent labs & imaging results that were available during my care of the patient were reviewed by me and considered in my medical decision making (see chart for  details).  Clinical Course as of Dec 14 1500  Wed Dec 14, 2016  [id number] Discussed lab results with the patient. States his pain and symptoms have improved overall.   [SJ]  [id number] Patient voices significant overall improvement in how he feels.  States he is relatively pain-free.  Patient is nontoxic-appearing.  [SJ]  1025 Patient reexamined.  States he feels much better and is ready to go home. He again inquires about what the diagnosis would be in light of negative diagnostics, despite previous explanation of this.  Patient states he would like "STD testing of the throat." He denies penile discharge, history of GC, or suspicion of STD exposure. He states, "I just want that test." It was explained to the patient that I do not think that this is indicated.  Patient continues to stress that he "just wants it." Patient also inquires about an antibiotic.  I do not see an indication for antibiotic therapy at this time.  This was explained to the patient in detail.  [SJ]  Clinical Course User Index [SJ] Joy, Shawn C, PA-C    Patient presents with symptoms consistent with viral syndrome.  Tachycardic and febrile at initial presentation.  Significant improvement in patient's tachycardia and and improvement in how the patient feels over ED course. Tolerating PO.  PCP follow-up as needed. The patient was given instructions for home care as well as return precautions. Patient voices understanding of these instructions, accepts the plan, and is comfortable with discharge.  Findings and plan of care discussed with Azalia Bilis, MD. Dr. Patria Mane personally evaluated and examined this patient.  Vitals:   12/14/16 0738 12/14/16 0829 12/14/16 1047 12/14/16 1128  BP:  (!) 100/52 (!) 94/59 125/87  Pulse:  (!) 115 (!) 107 (!) 105  Resp:  20 18 18   Temp: (!) 102.8 F (39.3 C) (!) 100.8 F (38.2 C)  99.8 F (37.7 C)  TempSrc: Oral Oral  Oral  SpO2:  99% 98% 99%  Weight:      Height:          Final Clinical  Impressions(s) / ED Diagnoses   Final diagnoses:  Viral syndrome    ED Discharge Orders        Ordered    ibuprofen (ADVIL,MOTRIN) 600 MG tablet  Every 6 hours PRN     12/14/16 0901    ondansetron (ZOFRAN ODT) 4 MG disintegrating tablet  Every 8 hours PRN     12/14/16 0901       Anselm Pancoast, PA-C 12/14/16 1502    Azalia Bilis, MD 12/15/16 [phone]

## 2016-12-14 NOTE — Discharge Instructions (Signed)
Your symptoms are consistent with a viral illness. Viruses do not require antibiotics. Treatment is symptomatic care and it is important to note that these symptoms may last for 7-14 days.  ° °Hand washing: Wash your hands throughout the day, but especially before and after touching the face, using the restroom, sneezing, coughing, or touching surfaces that have been coughed or sneezed upon. °Hydration: Symptoms will be intensified and complicated by dehydration. Dehydration can also extend the duration of symptoms. Drink plenty of fluids and get plenty of rest. You should be drinking at least half a liter of water an hour to stay hydrated. Electrolyte drinks (ex. Gatorade, Powerade, Pedialyte) are also encouraged. You should be drinking enough fluids to make your urine light yellow, almost clear. If this is not the case, you are not drinking enough water. °Please note that some of the treatments indicated below will not be effective if you are not adequately hydrated. °Pain or fever: Ibuprofen, Naproxen, or Tylenol for pain or fever.  °Nausea/vomiting: Use the Zofran for nausea or vomiting.  °Zyrtec or Claritin: May add these medication daily to control underlying symptoms of congestion, sneezing, and other signs of allergies. °Flonase: Use this medication, as directed, for nasal and sinus congestion. °Congestion: Plain Mucinex may help relieve congestion. Saline sinus rinses and saline nasal sprays may also help relieve congestion. If you do not have heart problems or an allergy to such medications, you may also try phenylephrine or Sudafed. °Sore throat: Warm liquids or Chloraseptic spray may help soothe a sore throat. Gargle twice a day with a salt water solution made from a half teaspoon of salt in a cup of warm water.  °Follow up: Follow up with a primary care provider, as needed, for any future management of this issue. °

## 2016-12-14 NOTE — ED Triage Notes (Signed)
Pt states he has fever, chills, headache, sore throat, nausea, and diarrhea  Sxs started Sunday

## 2016-12-15 LAB — GC/CHLAMYDIA PROBE AMP (~~LOC~~) NOT AT ARMC
Chlamydia: NEGATIVE
NEISSERIA GONORRHEA: POSITIVE — AB

## 2016-12-16 ENCOUNTER — Emergency Department (HOSPITAL_COMMUNITY)
Admission: EM | Admit: 2016-12-16 | Discharge: 2016-12-16 | Disposition: A | Discharge status: Home / Self Care | Payer: BLUE CROSS/BLUE SHIELD | Attending: Emergency Medicine | Admitting: Emergency Medicine

## 2016-12-16 ENCOUNTER — Encounter (HOSPITAL_COMMUNITY): Payer: Self-pay

## 2016-12-16 ENCOUNTER — Other Ambulatory Visit: Payer: Self-pay

## 2016-12-16 DIAGNOSIS — Z79899 Other long term (current) drug therapy: Secondary | ICD-10-CM | POA: Diagnosis not present

## 2016-12-16 DIAGNOSIS — I10 Essential (primary) hypertension: Secondary | ICD-10-CM | POA: Diagnosis not present

## 2016-12-16 DIAGNOSIS — J029 Acute pharyngitis, unspecified: Secondary | ICD-10-CM | POA: Diagnosis not present

## 2016-12-16 DIAGNOSIS — A545 Gonococcal pharyngitis: Secondary | ICD-10-CM

## 2016-12-16 DIAGNOSIS — R799 Abnormal finding of blood chemistry, unspecified: Secondary | ICD-10-CM | POA: Diagnosis present

## 2016-12-16 LAB — CULTURE, GROUP A STREP (THRC)

## 2016-12-16 MED ORDER — CEFTRIAXONE SODIUM 250 MG IJ SOLR
250.0000 mg | Freq: Once | INTRAMUSCULAR | Status: AC
  Administered 2016-12-16: 250 mg via INTRAMUSCULAR
  Filled 2016-12-16: qty 250

## 2016-12-16 MED ORDER — DEXAMETHASONE SODIUM PHOSPHATE 10 MG/ML IJ SOLN
10.0000 mg | Freq: Once | INTRAMUSCULAR | Status: AC
  Administered 2016-12-16: 10 mg via INTRAMUSCULAR

## 2016-12-16 MED ORDER — DEXAMETHASONE SODIUM PHOSPHATE 10 MG/ML IJ SOLN
10.0000 mg | Freq: Once | INTRAMUSCULAR | Status: DC
  Filled 2016-12-16: qty 1

## 2016-12-16 MED ORDER — AZITHROMYCIN 250 MG PO TABS
1000.0000 mg | ORAL_TABLET | Freq: Once | ORAL | Status: AC
  Administered 2016-12-16: 1000 mg via ORAL
  Filled 2016-12-16: qty 4

## 2016-12-16 MED ORDER — LIDOCAINE HCL (PF) 1 % IJ SOLN
INTRAMUSCULAR | Status: AC
  Administered 2016-12-16: 5 mL
  Filled 2016-12-16: qty 5

## 2016-12-16 NOTE — ED Provider Notes (Signed)
[facility]-EMERGENCY DEPT Provider Note   CSN: [id number] Arrival date & time: 12/16/16  1734     History   Chief Complaint Chief Complaint  Patient presents with  . Abnormal Lab    HPI Rhunette CroftRufus Garfinkle is a 26 y.o. male with a history of hypertension who presents to the emergency department with a chief complaint of abnormal lab result.  The patient reports that he was tested on 12/14/2016 for gonorrhea of the pharynx.  He received a call from the hospital today that the result was positive, and he needed to return for treatment.  He reports continued sore throat.  He also reports fevers over the last few days that have resolved with alternating Tylenol and ibuprofen.  He denies drooling, muffled voice, respiratory distress, or trismus.   The history is provided by the patient. No language interpreter was used.    Past Medical History:  Diagnosis Date  . Hypertension     There are no active problems to display for this patient.   History reviewed. No pertinent surgical history.     Home Medications    Prior to Admission medications   Medication Sig Start Date End Date Taking? Authorizing Provider  ibuprofen (ADVIL,MOTRIN) 600 MG tablet Take 1 tablet (600 mg total) by mouth every 6 (six) hours as needed. 12/14/16   Joy, Shawn C, PA-C  losartan-hydrochlorothiazide (HYZAAR) 100-12.5 MG tablet Take 1 tablet by mouth daily. 09/12/16   [provider]  ondansetron (ZOFRAN ODT) 4 MG disintegrating tablet Take 1 tablet (4 mg total) by mouth every 8 (eight) hours as needed for nausea or vomiting. 12/14/16   Joy, Hillard DankerShawn C, PA-C    Family History Family History  Problem Relation Age of Onset  . Hypertension Other   . Cancer Other     Social History Social History   Tobacco Use  . Smoking status: Never Smoker  . Smokeless tobacco: Never Used  Substance Use Topics  . Alcohol use: Yes    Comment: social  . Drug use: No     Allergies   Ace  inhibitors   Review of Systems Review of Systems  Constitutional: Positive for fever.  HENT: Positive for sore throat.   Skin: Negative for rash.     Physical Exam Updated Vital Signs BP 131/86 (BP Location: Left Arm)   Pulse 70   Temp 98.3 F (36.8 C) (Oral)   Resp 18   Ht 6' (1.829 m)   Wt 127 kg (280 lb)   SpO2 100%   BMI 37.97 kg/m   Physical Exam  Constitutional: He appears well-developed.  HENT:  Head: Normocephalic.  Mouth/Throat: Uvula is midline. No trismus in the jaw. Oropharyngeal exudate, posterior oropharyngeal edema and posterior oropharyngeal erythema present. No tonsillar abscesses.  Eyes: Conjunctivae are normal.  Neck: Neck supple.  Cardiovascular: Normal rate and regular rhythm.  No murmur heard. Pulmonary/Chest: Effort normal.  Abdominal: Soft. He exhibits no distension.  Neurological: He is alert.  Skin: Skin is warm and dry.  Psychiatric: His behavior is normal.  Nursing note and vitals reviewed.    ED Treatments / Results  Labs (all labs ordered are listed, but only abnormal results are displayed) Labs Reviewed - No data to display  EKG  EKG Interpretation None       Radiology No results found.  Procedures Procedures (including critical care time)  Medications Ordered in ED Medications  cefTRIAXone (ROCEPHIN) injection 250 mg (250 mg Intramuscular Given 12/16/16 2114)  azithromycin [facility](ZITHROMAX) tablet 1,000 mg (1,000 mg Oral Given 12/16/16 2110)  lidocaine (PF) (XYLOCAINE) 1 % injection (5 mLs  Given 12/16/16 2115)  dexamethasone (DECADRON) injection 10 mg (10 mg Intramuscular Given 12/16/16 2113)     Initial Impression / Assessment and Plan / ED Course  I have reviewed the triage vital signs and the nursing notes.  Pertinent labs & imaging results that were available during my care of the patient were reviewed by me and considered in my medical decision making (see chart for details).     26 year old male with a history of  hypertension presenting to the emergency department with a sore throat.  He was tested for gonorrhea of the pharynx on 12 5, and received a call today that the test was positive.  The patient was treated with azithromycin and ceftriaxone in the emergency department.  Decadron administered to help with inflammation.  The patient was observed for side effects of following the medications.  He has good follow-up with his PCP and was instructed to follow-up if his symptoms do not start to improve in the next few days as gonorrhea of the pharynx is the most difficult to treat due to resistance.  Strict return to the emergency department was given if symptoms worsen.  He is hemodynamically stable.  No acute distress.  The patient is safe for discharge at this time.  Final Clinical Impressions(s) / ED Diagnoses   Final diagnoses:  Gonorrhea of pharynx in male    ED Discharge Orders    None       Barkley BoardsMcDonald, Leelynn Whetsel A, PA-C 12/16/16 2142    Alvira MondaySchlossman, Erin, MD 12/19/16 [phone]

## 2016-12-16 NOTE — ED Triage Notes (Signed)
Patient states he was called back because he had a positive gonorrhea throat swab. Patient states he returned to [facility] and was prescribed a general antibiotic and a steroid for inflammation, but they were unaware of positive gonorrhea

## 2016-12-16 NOTE — Discharge Instructions (Signed)
Gonorrhea can sometimes be more difficult to treat in the throat.  It is important to abstain from all sexual activities for 7 days after treatment.  It is important that all sexual partners know that you tested positive for gonorrhea as it can be transmitted via oral, anal, vaginal intercourse.  Please stop taking the clindamycin antibiotic.  Your symptoms should start to improve in the next 48-72 hours.  If you continue to have symptoms that are not improving in 72 hours, please follow-up with your primary care provider.  If you have worsening symptoms, including drooling, muffled voice, difficulty breathing or swallowing, or gasping for air, please return to the emergency department for evaluation.

## 2018-02-18 ENCOUNTER — Ambulatory Visit (HOSPITAL_COMMUNITY)
Admission: EM | Admit: 2018-02-18 | Discharge: 2018-02-18 | Disposition: A | Discharge status: Home / Self Care | Payer: BLUE CROSS/BLUE SHIELD | Attending: Emergency Medicine | Admitting: Emergency Medicine

## 2018-02-18 ENCOUNTER — Encounter (HOSPITAL_COMMUNITY): Payer: Self-pay

## 2018-02-18 DIAGNOSIS — R300 Dysuria: Secondary | ICD-10-CM

## 2018-02-18 LAB — POCT URINALYSIS DIP (DEVICE)
Glucose, UA: NEGATIVE mg/dL
Hgb urine dipstick: NEGATIVE
LEUKOCYTES UA: NEGATIVE
NITRITE: NEGATIVE
Protein, ur: NEGATIVE mg/dL
Specific Gravity, Urine: >=1.03 (ref 1.005–1.030)
UROBILINOGEN UA: 2 mg/dL — AB (ref 0.0–1.0)
pH: 5.5 (ref 5.0–8.0)

## 2018-02-18 MED ORDER — CEFTRIAXONE SODIUM 250 MG IJ SOLR
INTRAMUSCULAR | Status: AC
  Filled 2018-02-18: qty 250

## 2018-02-18 MED ORDER — CEFTRIAXONE SODIUM 250 MG IJ SOLR
250.0000 mg | Freq: Once | INTRAMUSCULAR | Status: AC
  Administered 2018-02-18: 250 mg via INTRAMUSCULAR

## 2018-02-18 MED ORDER — AZITHROMYCIN 250 MG PO TABS
ORAL_TABLET | ORAL | Status: AC
  Filled 2018-02-18: qty 4

## 2018-02-18 MED ORDER — STERILE WATER FOR INJECTION IJ SOLN
INTRAMUSCULAR | Status: AC
  Filled 2018-02-18: qty 10

## 2018-02-18 MED ORDER — AZITHROMYCIN 250 MG PO TABS
1000.0000 mg | ORAL_TABLET | Freq: Once | ORAL | Status: AC
  Administered 2018-02-18: 1000 mg via ORAL

## 2018-02-18 NOTE — Discharge Instructions (Signed)
We have treated you today for gonorrhea and chlamydia, with rocephin and azithromycin. Please refrain from sexual activity for 7 days while medicine is clearing infection. ° °We are testing you for Gonorrhea, Chlamydia and Trichomonas. We will call you if anything is positive and let you know if you require any further treatment. Please inform partner of any positive results. ° °Please return if symptoms not improving with treatment, development of fever, nausea, vomiting, abdominal pain, scrotal pain. °

## 2018-02-18 NOTE — ED Provider Notes (Signed)
[facility]    CSN: [id number] Arrival date & time: 02/18/18  1647     History   Chief Complaint Chief Complaint  Patient presents with  . Urinary Tract Infection    HPI Roy Henderson is a 28 y.o. male history of hypertension presenting today for evaluation of dysuria. Patient states that for the last 4-5 days he has had dysuria and penile discharge. Denies new partners. Denies rashes or lesions. Denies increased frequency or abdominal pain. Denies nausea and vomiting.   HPI  Past Medical History:  Diagnosis Date  . Hypertension     There are no active problems to display for this patient.   History reviewed. No pertinent surgical history.     Home Medications    Prior to Admission medications   Medication Sig Start Date End Date Taking? Authorizing Provider  ibuprofen (ADVIL,MOTRIN) 600 MG tablet Take 1 tablet (600 mg total) by mouth every 6 (six) hours as needed. 12/14/16   Joy, Shawn C, PA-C  losartan-hydrochlorothiazide (HYZAAR) 100-12.5 MG tablet Take 1 tablet by mouth daily. 09/12/16   [provider]  ondansetron (ZOFRAN ODT) 4 MG disintegrating tablet Take 1 tablet (4 mg total) by mouth every 8 (eight) hours as needed for nausea or vomiting. 12/14/16   Joy, Hillard DankerShawn C, PA-C    Family History Family History  Problem Relation Age of Onset  . Hypertension Other   . Cancer Other     Social History Social History   Tobacco Use  . Smoking status: Never Smoker  . Smokeless tobacco: Never Used  Substance Use Topics  . Alcohol use: Yes    Comment: social  . Drug use: No     Allergies   Ace inhibitors   Review of Systems Review of Systems  Constitutional: Negative for fever.  HENT: Negative for sore throat.   Respiratory: Negative for shortness of breath.   Cardiovascular: Negative for chest pain.  Gastrointestinal: Negative for abdominal pain, nausea and vomiting.  Genitourinary: Positive for discharge and dysuria. Negative for  difficulty urinating, frequency, penile pain, penile swelling, scrotal swelling and testicular pain.  Skin: Negative for rash.  Neurological: Negative for dizziness, light-headedness and headaches.     Physical Exam Triage Vital Signs ED Triage Vitals  Enc Vitals Group     BP 02/18/18 1730 139/78     Pulse Rate 02/18/18 1730 85     Resp 02/18/18 1730 20     Temp 02/18/18 1730 98.8 F (37.1 C)     Temp Source 02/18/18 1730 Oral     SpO2 02/18/18 1730 100 %     Weight --      Height --      Head Circumference --      Peak Flow --      Pain Score 02/18/18 1732 7     Pain Loc --      Pain Edu? --      Excl. in GC? --    No data found.  Updated Vital Signs BP 139/78 (BP Location: Right Arm)   Pulse 85   Temp 98.8 F (37.1 C) (Oral)   Resp 20   SpO2 100%   Visual Acuity Right Eye Distance:   Left Eye Distance:   Bilateral Distance:    Right Eye Near:   Left Eye Near:    Bilateral Near:     Physical Exam Vitals signs and nursing note reviewed.  Constitutional:      Appearance: He is well-developed.  Comments: No acute distress  HENT:     Head: Normocephalic and atraumatic.     Nose: Nose normal.  Eyes:     Conjunctiva/sclera: Conjunctivae normal.  Neck:     Musculoskeletal: Neck supple.  Cardiovascular:     Rate and Rhythm: Normal rate.  Pulmonary:     Effort: Pulmonary effort is normal. No respiratory distress.  Abdominal:     General: There is no distension.     Comments: nontender to light and deep palpation  Genitourinary:    Comments: deferred Musculoskeletal: Normal range of motion.  Skin:    General: Skin is warm and dry.  Neurological:     Mental Status: He is alert and oriented to person, place, and time.      UC Treatments / Results  Labs (all labs ordered are listed, but only abnormal results are displayed) Labs Reviewed  POCT URINALYSIS DIP (DEVICE) - Abnormal; Notable for the following components:      Result Value   Bilirubin  Urine SMALL (*)    Ketones, ur TRACE (*)    Urobilinogen, UA 2.0 (*)    All other components within normal limits  URINE CYTOLOGY ANCILLARY ONLY    EKG None  Radiology No results found.  Procedures Procedures (including critical care time)  Medications Ordered in UC Medications  cefTRIAXone (ROCEPHIN) injection 250 mg (250 mg Intramuscular Given 02/18/18 1803)  azithromycin (ZITHROMAX) tablet 1,000 mg (1,000 mg Oral Given 02/18/18 1803)    Initial Impression / Assessment and Plan / UC Course  I have reviewed the triage vital signs and the nursing notes.  Pertinent labs & imaging results that were available during my care of the patient were reviewed by me and considered in my medical decision making (see chart for details).     Urine cytology sent, Rocephin and azithromycin empirically given. Will call with results and provide further treatment if needed. Refrain from intercourse for 1 week. Discussed strict return precautions. Patient verbalized understanding and is agreeable with plan. Final Clinical Impressions(s) / UC Diagnoses   Final diagnoses:  Dysuria     Discharge Instructions     We have treated you today for gonorrhea and chlamydia, with rocephin and azithromycin. Please refrain from sexual activity for 7 days while medicine is clearing infection.  We are testing you for Gonorrhea, Chlamydia and Trichomonas. We will call you if anything is positive and let you know if you require any further treatment. Please inform partner of any positive results.  Please return if symptoms not improving with treatment, development of fever, nausea, vomiting, abdominal pain, scrotal pain.    ED Prescriptions    None     Controlled Substance Prescriptions [place] Controlled Substance Registry consulted? Not Applicable   Lew Dawes, [place] 02/18/18 2218

## 2018-02-18 NOTE — ED Triage Notes (Signed)
Pt presents with burning during urination X 5 days.

## 2018-02-19 LAB — URINE CYTOLOGY ANCILLARY ONLY
Chlamydia: NEGATIVE
NEISSERIA GONORRHEA: NEGATIVE
TRICH (WINDOWPATH): NEGATIVE

## 2019-06-03 IMAGING — CR DG CHEST 2V
2 series · 2 of 2 positions shown · non-contrast
Comparison: None.

CLINICAL DATA: Fever for 2 days.  Body aches and weakness.

EXAM:
CHEST  2 VIEW

[w chest pa]
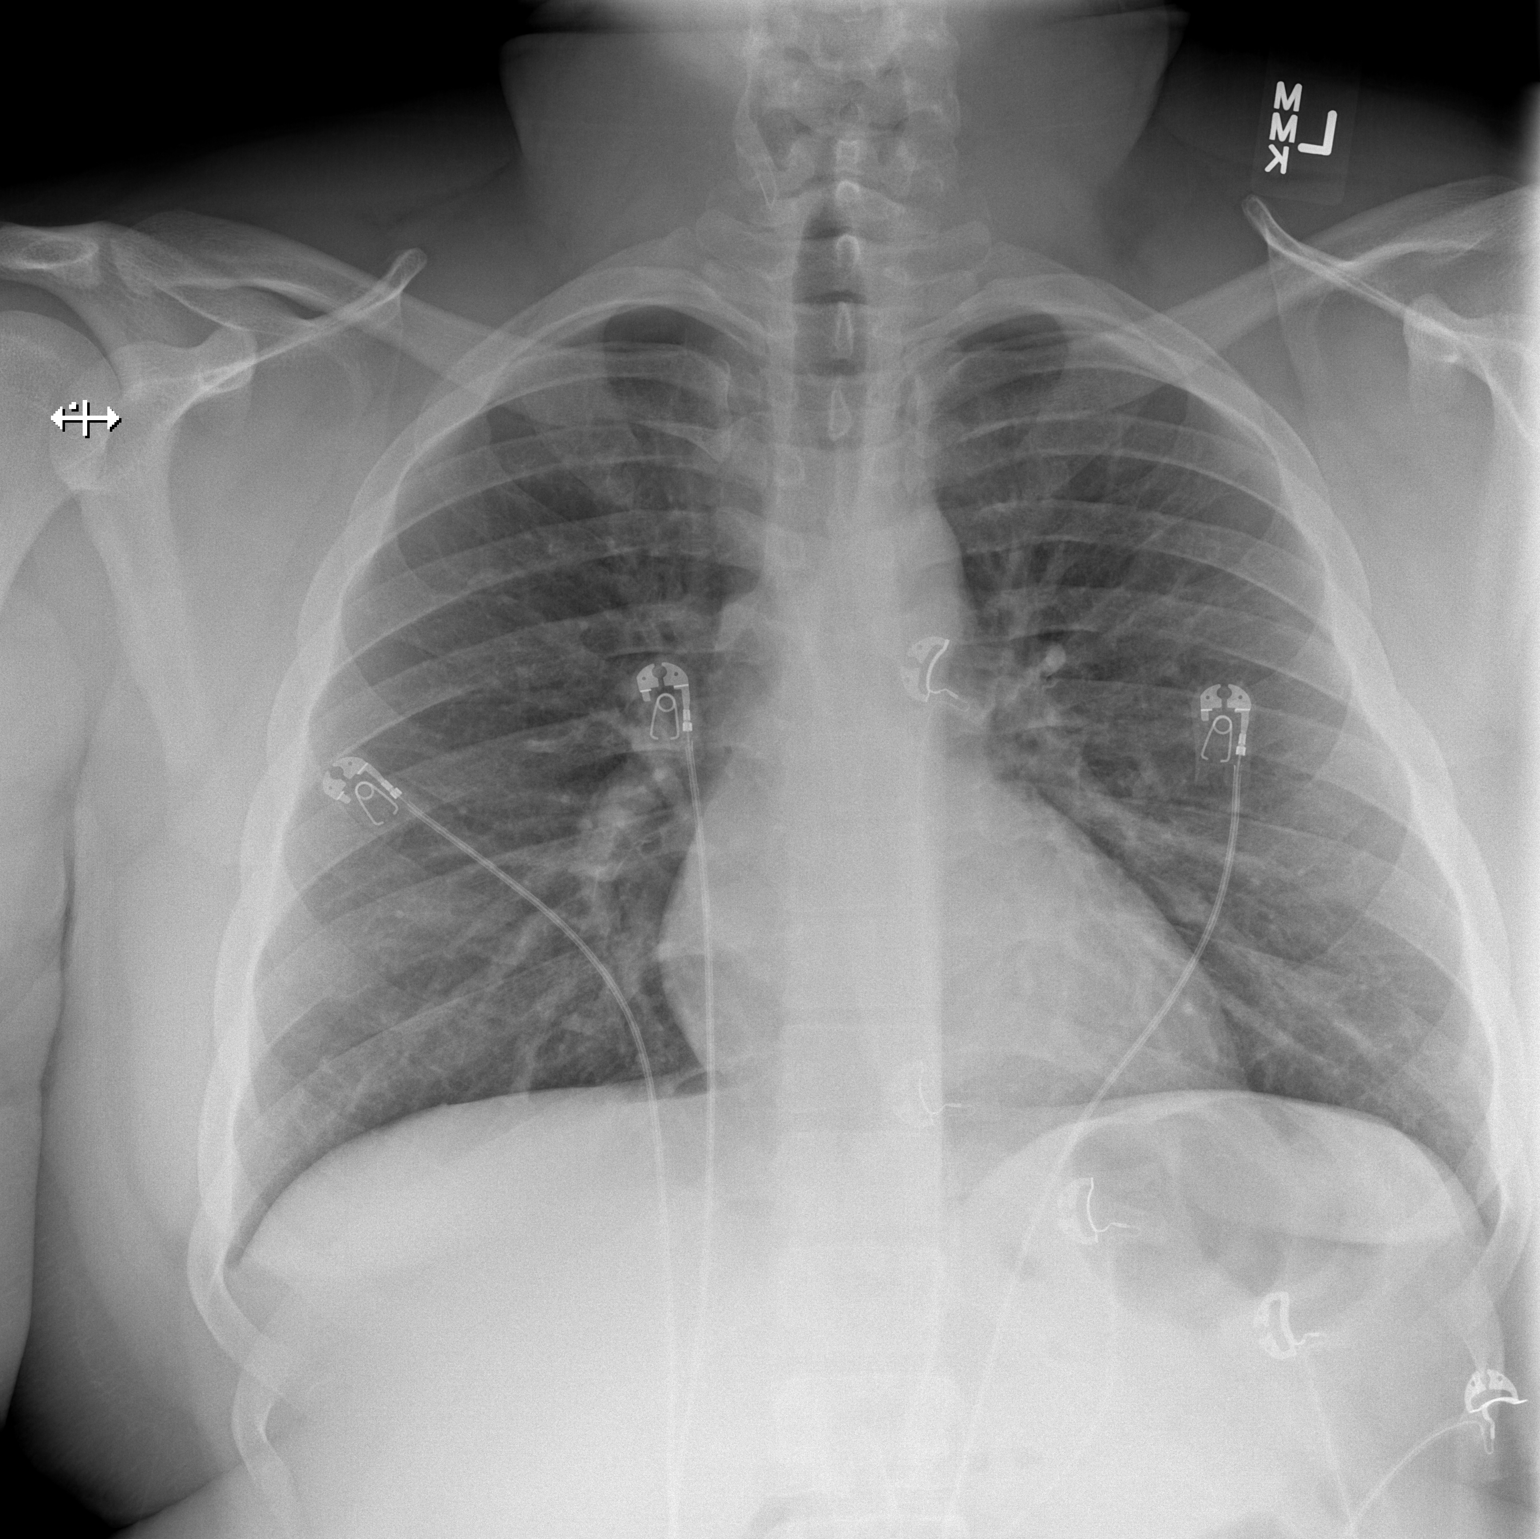

[w chest lat]
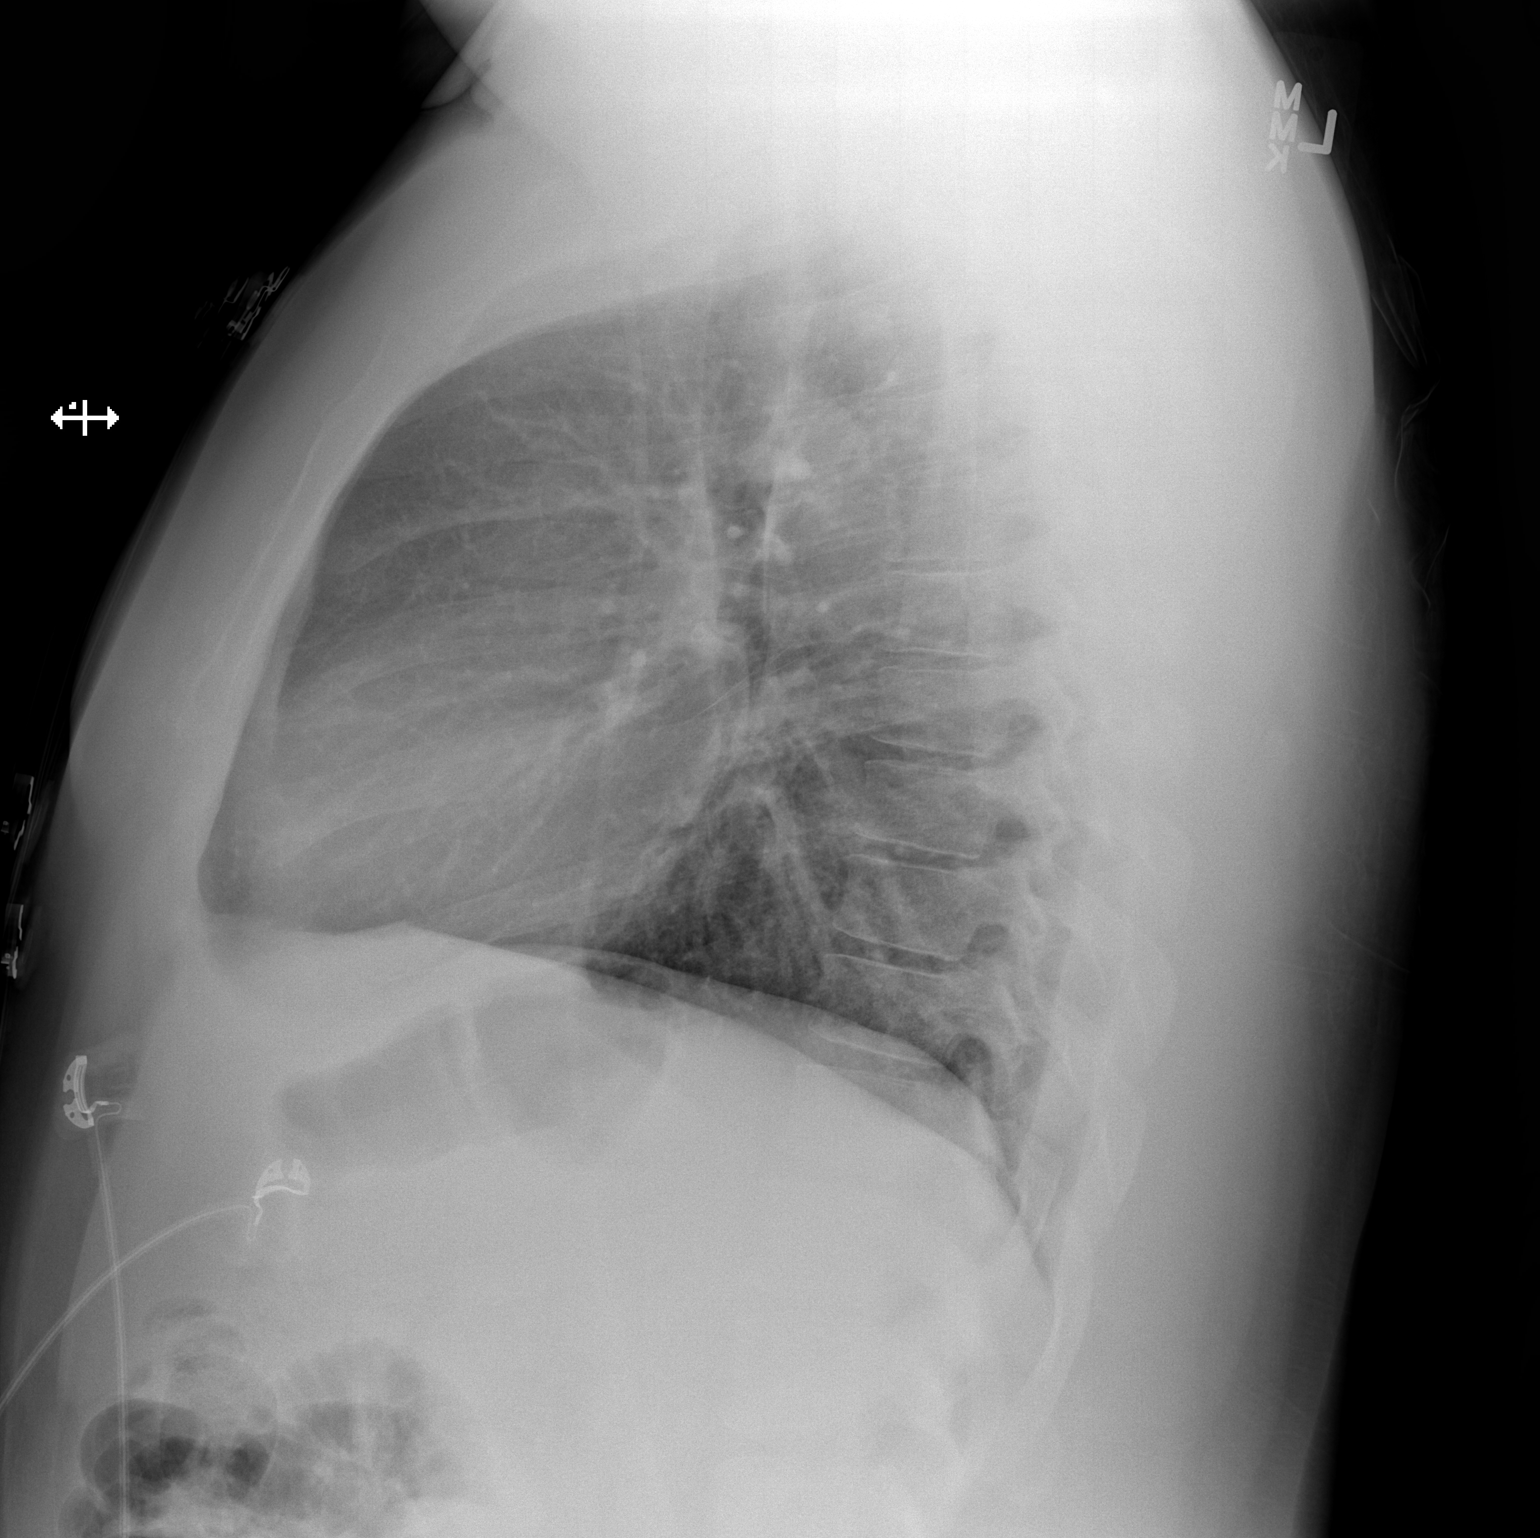

[2 of 2 positions shown; findings below may reference images not displayed]

FINDINGS: Lungs are clear. Heart size is normal. No pneumothorax or pleural
fluid. No bony abnormality.
IMPRESSION: Normal chest.
